# Patient Record
Sex: Female | Born: 2000 | Race: Black or African American | Hispanic: No | Marital: Single
Health system: Southern US, Community
[De-identification: ages and names within clinical notes are randomized; demographics above are authoritative.]

---
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
## 2022-02-05 IMAGING — DX DG ELBOW COMPLETE 3+V*L*
4 series · 4 of 4 positions shown · non-contrast
Comparison: None.

CLINICAL DATA: Ten days of left elbow swelling.  No injury

EXAM:
LEFT ELBOW - COMPLETE 3+ VIEW

[elbow ap]
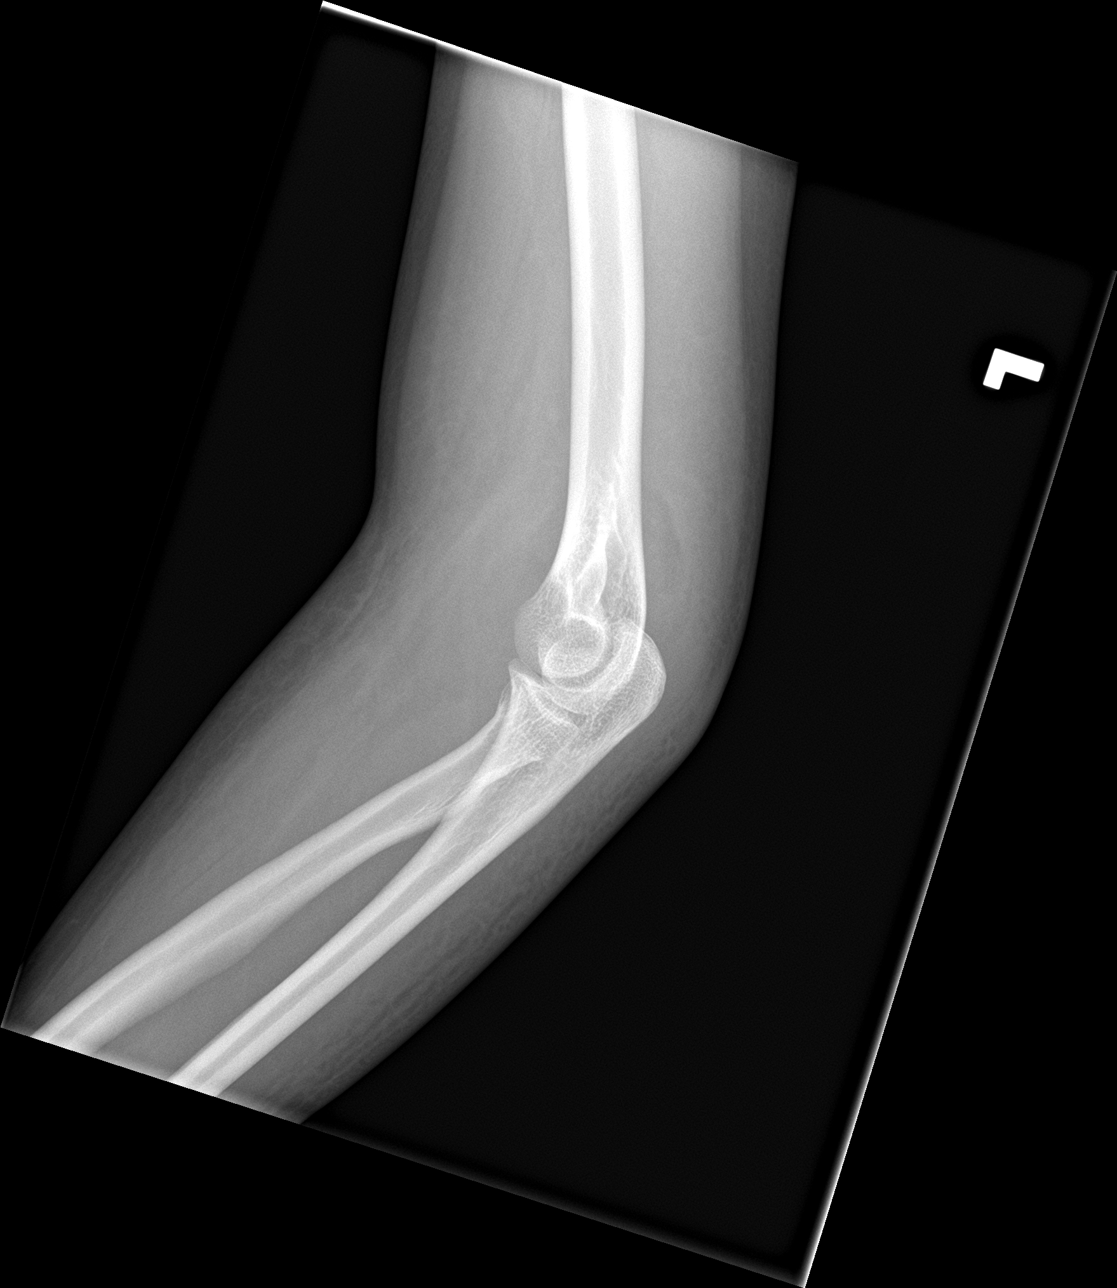

[elbow obl (1 of 2)]
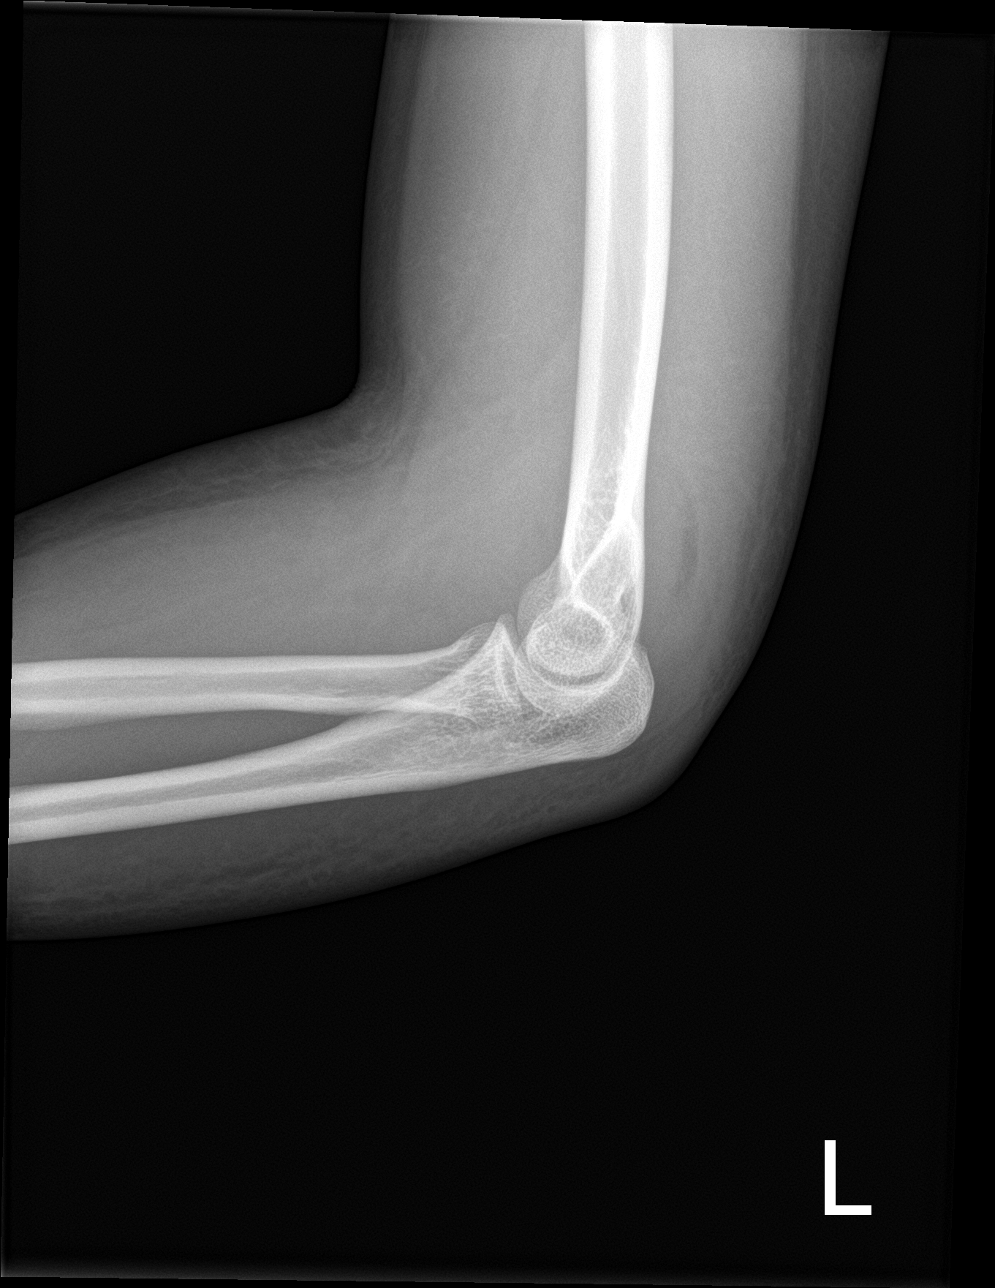

[elbow obl (2 of 2)]
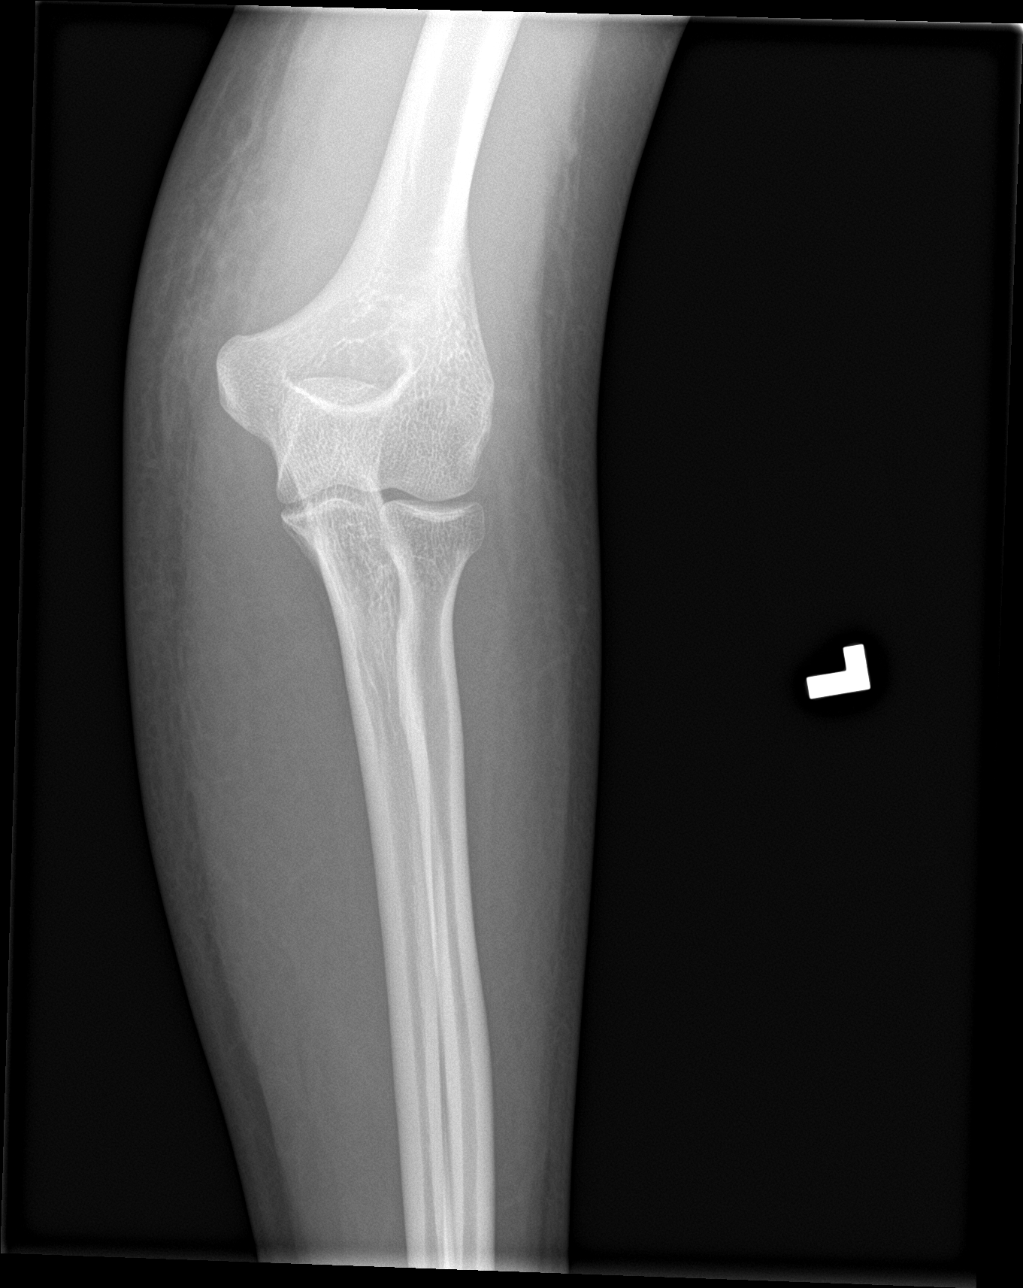

[elbow lat]
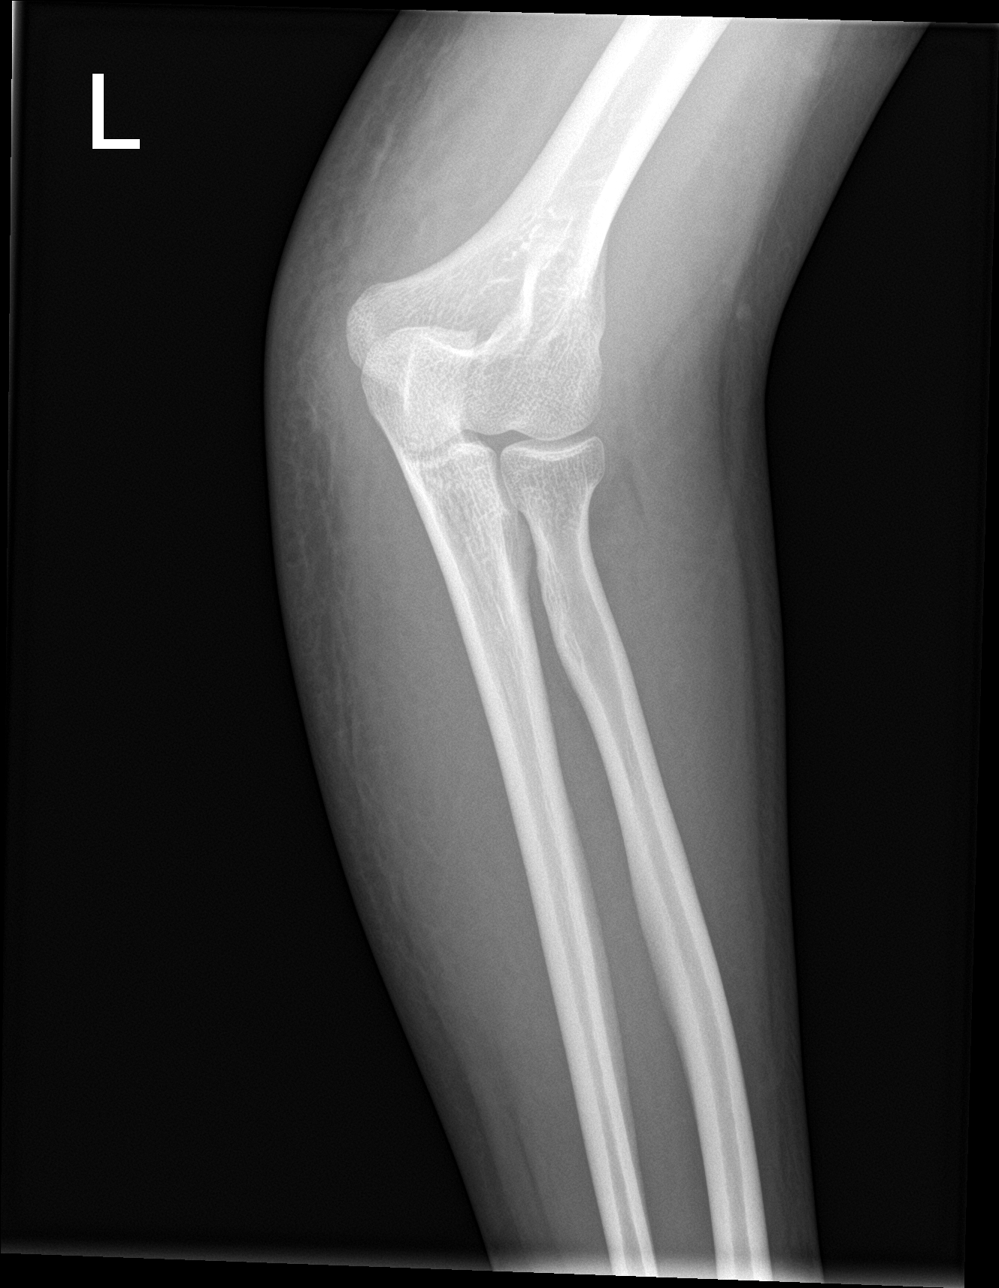

[4 of 4 positions shown; findings below may reference images not displayed]

FINDINGS: Subcutaneous edema with an elbow joint effusion. There is no
visualized fracture or dislocation. There is no evidence of
arthropathy or osseous erosions.
IMPRESSION: Subcutaneous edema with and elbow joint effusion.

## 2022-02-05 IMAGING — CT CT ELBOW*L* W/CM
4 of 5 series · 13 of 35 positions shown, 16 images · IV contrast (omnipaque)
Comparison: Radiograph 08/20/2021

CLINICAL DATA: Elbow swelling

EXAM:
CT OF THE UPPER LEFT EXTREMITY WITH CONTRAST
TECHNIQUE: Multidetector CT imaging of the upper left extremity was performed
according to the standard protocol following intravenous contrast
administration.
CONTRAST:  100mL OMNIPAQUE IOHEXOL 300 MG/ML  SOLN

[Series 7: ax bone · axial · 0.30mm/px · z∈[-622,-520]mm · 5 of 157 slices shown, 7 images]
[im 27/157  soft-tissue]
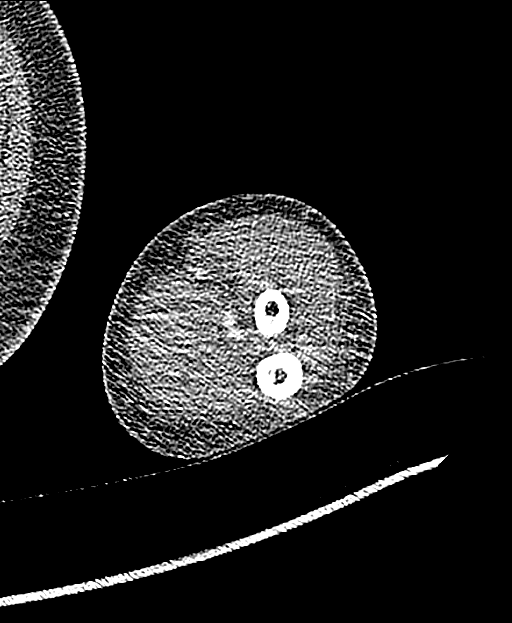
[im 27/157  bone]
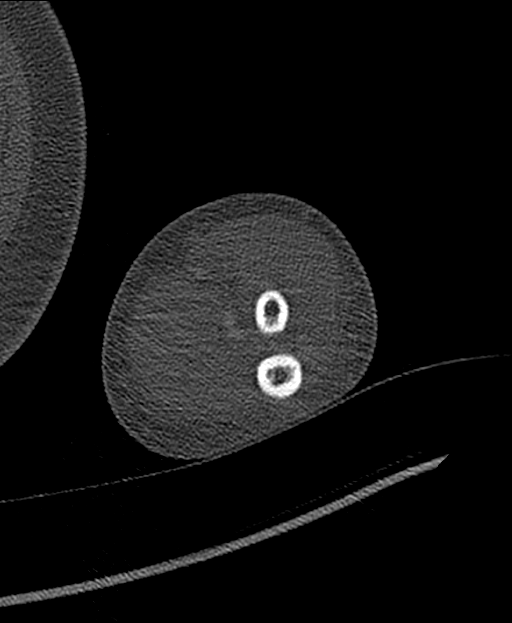
[im 53/157  bone]
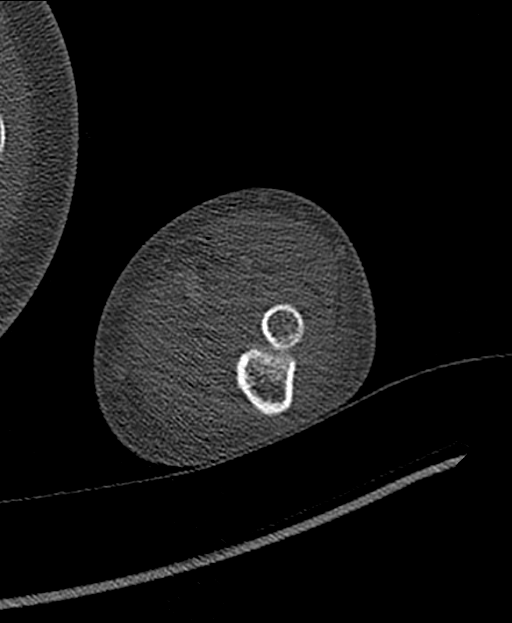
[im 79/157  bone]
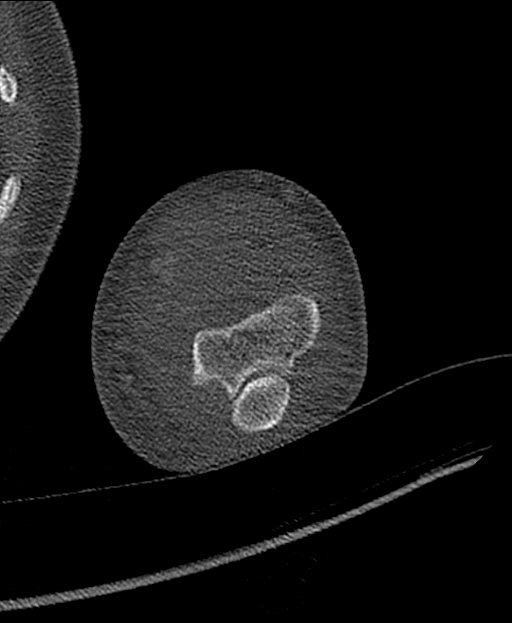
[im 105/157  bone]
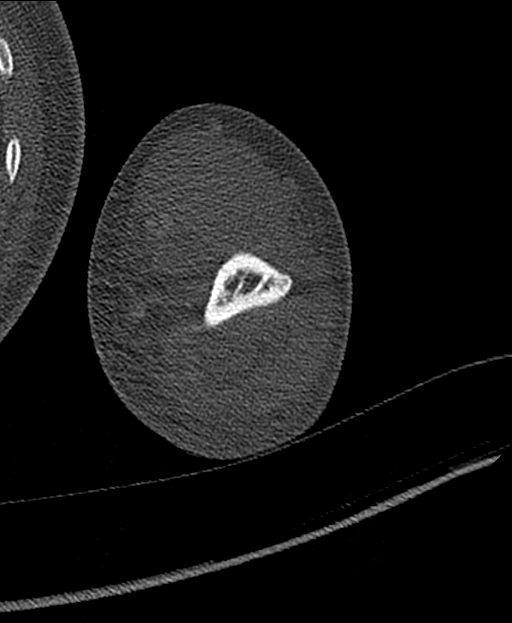
[im 131/157  soft-tissue]
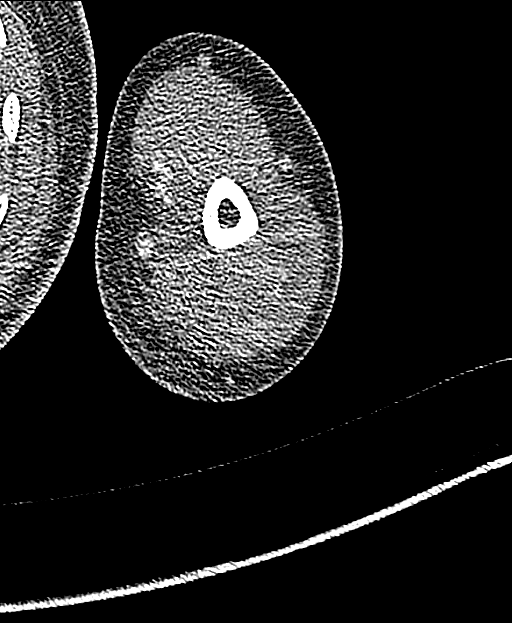
[im 131/157  bone]
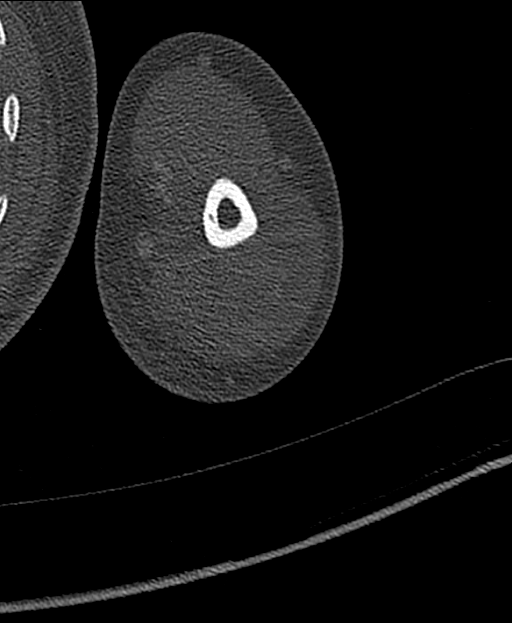

[Series 8: cor bone · coronal · 0.25mm/px · 1 of 154 slices shown]
[im 77/154  bone]
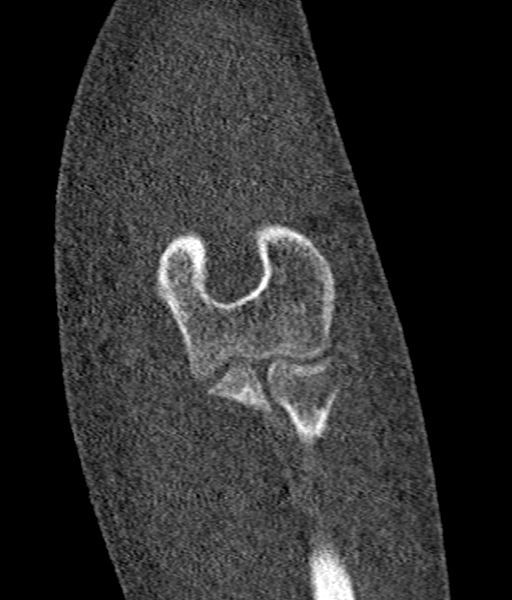

[Series 11: ax st · axial · 0.28mm/px · z∈[-622,-597]mm · 2 of 152 slices shown]
[im 26/152  bone]
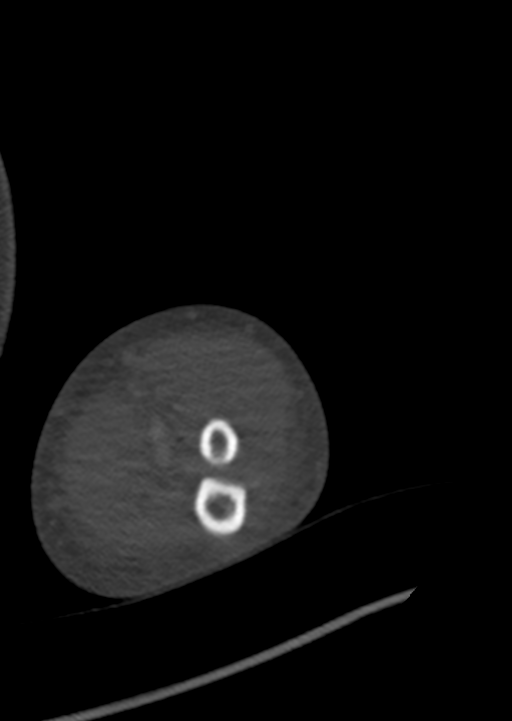
[im 51/152  bone]
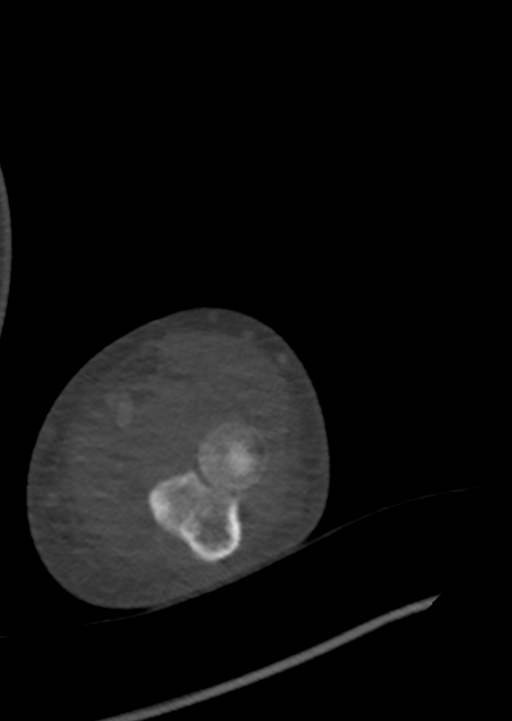

[Series 12: sag st · sagittal · 0.31mm/px · 5 of 128 slices shown, 6 images]
[im 43/128  bone]
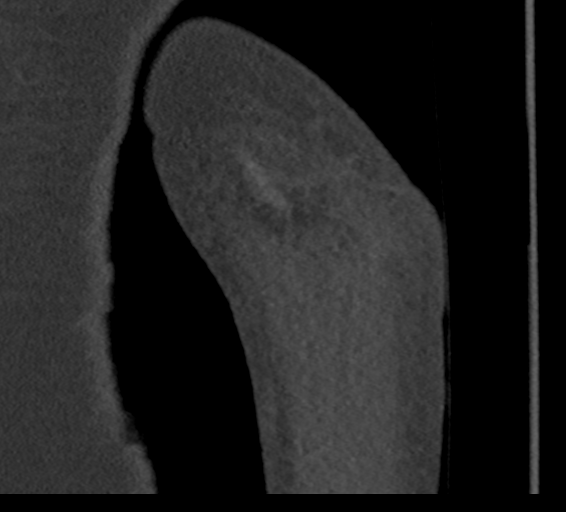
[im 53/128  bone]
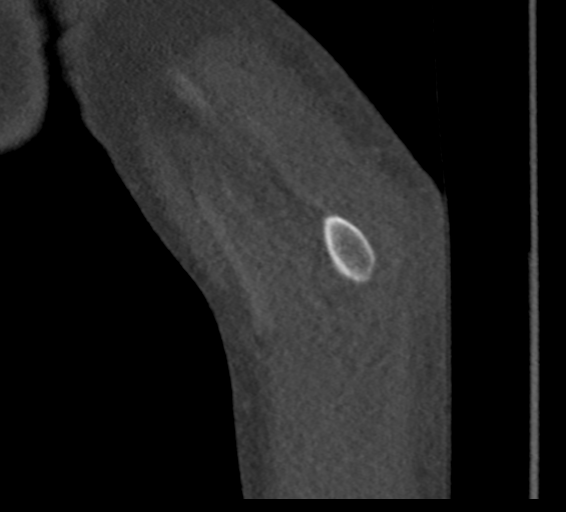
[im 64/128  soft-tissue]
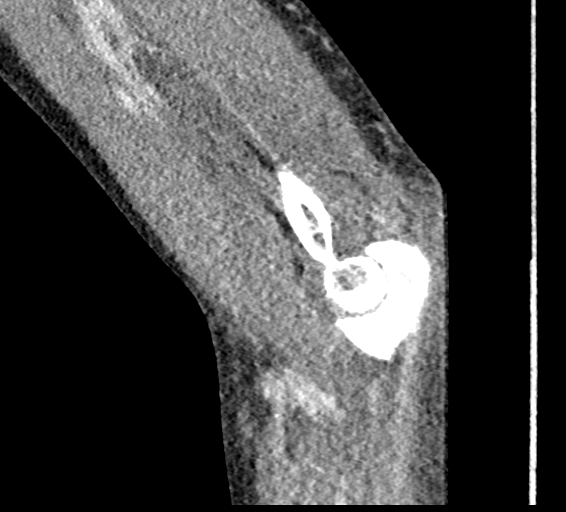
[im 64/128  bone]
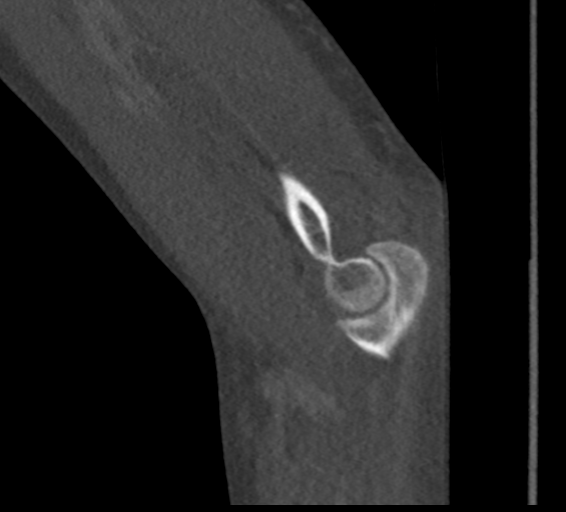
[im 75/128  bone]
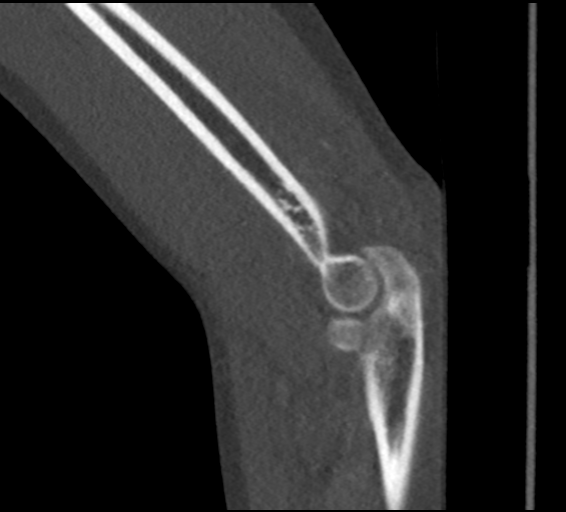
[im 85/128  bone]
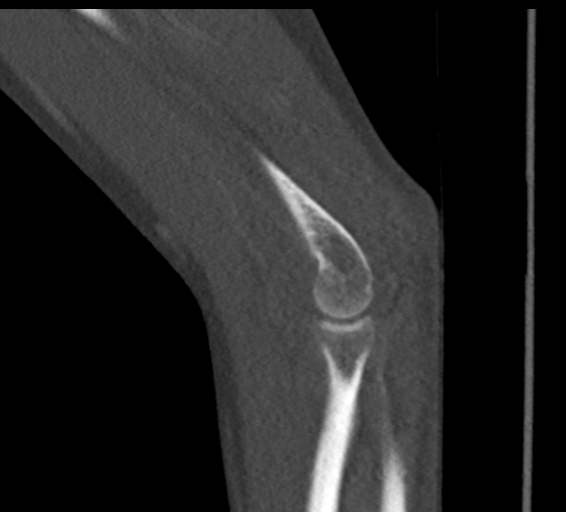

[13 of 35 positions shown; findings below may reference images not displayed]

FINDINGS: Bones/Joint/Cartilage

No fracture or malalignment. No periostitis or osseous destructive
change. No cortical erosions. Positive for elbow effusion

Ligaments

Suboptimally assessed.

Muscles and Tendons

Normal muscle bulk.  No gross intramuscular fluid collections.

Soft tissues

Moderate subcutaneous edema. No rim enhancing fluid collections to
suggest soft tissue abscess by CT.
IMPRESSION: 1. Elbow effusion with moderate subcutaneous edema. Findings could
be secondary to infection or inflammatory process. MRI follow-up
should be considered.
2. No convincing evidence for rim enhancing soft tissue abscess at
this time. There is no acute osseous abnormality.
